# Patient Record
Sex: Female | Born: 1995 | Race: White | Hispanic: No | Marital: Single | Smoking: Never smoker
Health system: Southern US, Community
[De-identification: ages and names within clinical notes are randomized; demographics above are authoritative.]

## PROBLEM LIST (undated history)

## (undated) DIAGNOSIS — F430 Acute stress reaction: Secondary | ICD-10-CM

---
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
## 2014-03-19 ENCOUNTER — Emergency Department (HOSPITAL_COMMUNITY): Payer: 59

## 2014-03-19 ENCOUNTER — Emergency Department (HOSPITAL_COMMUNITY)
Admission: EM | Admit: 2014-03-19 | Discharge: 2014-03-19 | Disposition: A | Discharge status: Home / Self Care | Payer: 59 | Attending: Emergency Medicine | Admitting: Emergency Medicine

## 2014-03-19 ENCOUNTER — Encounter (HOSPITAL_COMMUNITY): Payer: Self-pay | Admitting: Emergency Medicine

## 2014-03-19 DIAGNOSIS — Z23 Encounter for immunization: Secondary | ICD-10-CM | POA: Insufficient documentation

## 2014-03-19 DIAGNOSIS — Z880 Allergy status to penicillin: Secondary | ICD-10-CM | POA: Diagnosis not present

## 2014-03-19 DIAGNOSIS — S80811A Abrasion, right lower leg, initial encounter: Secondary | ICD-10-CM | POA: Insufficient documentation

## 2014-03-19 DIAGNOSIS — S8011XA Contusion of right lower leg, initial encounter: Secondary | ICD-10-CM | POA: Insufficient documentation

## 2014-03-19 DIAGNOSIS — Z79899 Other long term (current) drug therapy: Secondary | ICD-10-CM | POA: Diagnosis not present

## 2014-03-19 DIAGNOSIS — Y9289 Other specified places as the place of occurrence of the external cause: Secondary | ICD-10-CM | POA: Diagnosis not present

## 2014-03-19 DIAGNOSIS — Y9389 Activity, other specified: Secondary | ICD-10-CM | POA: Insufficient documentation

## 2014-03-19 DIAGNOSIS — S90112A Contusion of left great toe without damage to nail, initial encounter: Secondary | ICD-10-CM | POA: Insufficient documentation

## 2014-03-19 DIAGNOSIS — S40011A Contusion of right shoulder, initial encounter: Secondary | ICD-10-CM | POA: Diagnosis not present

## 2014-03-19 DIAGNOSIS — Y998 Other external cause status: Secondary | ICD-10-CM | POA: Diagnosis not present

## 2014-03-19 DIAGNOSIS — Z8659 Personal history of other mental and behavioral disorders: Secondary | ICD-10-CM | POA: Diagnosis not present

## 2014-03-19 DIAGNOSIS — S8991XA Unspecified injury of right lower leg, initial encounter: Secondary | ICD-10-CM | POA: Diagnosis present

## 2014-03-19 HISTORY — DX: Acute stress reaction: F43.0

## 2014-03-19 MED ORDER — MELOXICAM 7.5 MG PO TABS: 15.0000 mg | ORAL_TABLET | Freq: Every day | ORAL | Status: AC

## 2014-03-19 MED ORDER — NAPROXEN 500 MG PO TABS
500.0000 mg | ORAL_TABLET | Freq: Once | ORAL | Status: AC
  Administered 2014-03-19: 500 mg via ORAL
  Filled 2014-03-19: qty 1

## 2014-03-19 MED ORDER — TETANUS-DIPHTH-ACELL PERTUSSIS 5-2.5-18.5 LF-MCG/0.5 IM SUSP
0.5000 mL | Freq: Once | INTRAMUSCULAR | Status: AC
  Administered 2014-03-19: 0.5 mL via INTRAMUSCULAR
  Filled 2014-03-19: qty 0.5

## 2014-03-19 NOTE — Discharge Instructions (Signed)
Contusion °A contusion is a deep bruise. Contusions are the result of an injury that caused bleeding under the skin. The contusion may turn blue, purple, or yellow. Minor injuries will give you a painless contusion, but more severe contusions may stay painful and swollen for a few weeks.  °CAUSES  °A contusion is usually caused by a blow, trauma, or direct force to an area of the body. °SYMPTOMS  °· Swelling and redness of the injured area. °· Bruising of the injured area. °· Tenderness and soreness of the injured area. °· Pain. °DIAGNOSIS  °The diagnosis can be made by taking a history and physical exam. An X-ray, CT scan, or MRI may be needed to determine if there were any associated injuries, such as fractures. °TREATMENT  °Specific treatment will depend on what area of the body was injured. In general, the best treatment for a contusion is resting, icing, elevating, and applying cold compresses to the injured area. Over-the-counter medicines may also be recommended for pain control. Ask your caregiver what the best treatment is for your contusion. °HOME CARE INSTRUCTIONS  °· Put ice on the injured area. °· Put ice in a plastic bag. °· Place a towel between your skin and the bag. °· Leave the ice on for 15-20 minutes, 3-4 times a day, or as directed by your health care provider. °· Only take over-the-counter or prescription medicines for pain, discomfort, or fever as directed by your caregiver. Your caregiver may recommend avoiding anti-inflammatory medicines (aspirin, ibuprofen, and naproxen) for 48 hours because these medicines may increase bruising. °· Rest the injured area. °· If possible, elevate the injured area to reduce swelling. °SEEK IMMEDIATE MEDICAL CARE IF:  °· You have increased bruising or swelling. °· You have pain that is getting worse. °· Your swelling or pain is not relieved with medicines. °MAKE SURE YOU:  °· Understand these instructions. °· Will watch your condition. °· Will get help right  away if you are not doing well or get worse. °Document Released: 10/25/2004 Document Revised: 01/20/2013 Document Reviewed: 11/20/2010 °ExitCare® Patient Information ©2015 [employer]. This information is not intended to replace advice given to you by your health care provider. Make sure you discuss any questions you have with your health care provider. °RICE: Routine Care for Injuries °The routine care of many injuries includes Rest, Ice, Compression, and Elevation (RICE). °HOME CARE INSTRUCTIONS °· Rest is needed to allow your body to heal. Routine activities can usually be resumed when comfortable. Injured tendons and bones can take up to 6 weeks to heal. Tendons are the cord-like structures that attach muscle to bone. °· Ice following an injury helps keep the swelling down and reduces pain. °¨ Put ice in a plastic bag. °¨ Place a towel between your skin and the bag. °¨ Leave the ice on for 15-20 minutes, 3-4 times a day, or as directed by your health care provider. Do this while awake, for the first 24 to 48 hours. After that, continue as directed by your caregiver. °· Compression helps keep swelling down. It also gives support and helps with discomfort. If an elastic bandage has been applied, it should be removed and reapplied every 3 to 4 hours. It should not be applied tightly, but firmly enough to keep swelling down. Watch fingers or toes for swelling, bluish discoloration, coldness, numbness, or excessive pain. If any of these problems occur, remove the bandage and reapply loosely. Contact your caregiver if these problems continue. °· Elevation helps reduce swelling   and decreases pain. With extremities, such as the arms, hands, legs, and feet, the injured area should be placed near or above the level of the heart, if possible. °SEEK IMMEDIATE MEDICAL CARE IF: °· You have persistent pain and swelling. °· You develop redness, numbness, or unexpected weakness. °· Your symptoms are getting worse rather than  improving after several days. °These symptoms may indicate that further evaluation or further X-rays are needed. Sometimes, X-rays may not show a small broken bone (fracture) until 1 week or 10 days later. Make a follow-up appointment with your caregiver. Ask when your X-ray results will be ready. Make sure you get your X-ray results. °Document Released: 04/29/2000 Document Revised: 01/20/2013 Document Reviewed: 06/16/2010 °ExitCare® Patient Information ©2015 [employer]. This information is not intended to replace advice given to you by your health care provider. Make sure you discuss any questions you have with your health care provider. ° °

## 2014-03-19 NOTE — ED Notes (Signed)
Pt was thrown off a horse an hour ago. Landed on R shoulder, ribs and leg. Pt thinks horse stepped on L toe. Pt does not think she hit her head, no LOC. Pt ambulatory to triage room. No difficulty moving extremities.

## 2014-03-19 NOTE — ED Provider Notes (Signed)
CSN: [id number]     Arrival date & time 03/19/14  1840 History   None    Chief Complaint  Patient presents with  . Fall    off horse   Patient is a 19 y.o. female presenting with fall. The history is provided by the patient. No language interpreter was used.  Fall  This chart was scribed for non-physician practitioner, Antony MaduraKelly Winta Barcelo, PA-C, working with Toy CookeyMegan Docherty, MD, by Andrew Auaven Small, ED Scribe. This patient was seen in room WTR5/WTR5 and the patient's care was started at 9:50 PM.  [name] is a 19 y.o. female who presents to the Emergency Department complaining of fall that occurred 1 hour PTA. Pt states she was bucked off her horse causing her to fall and land on her right side. Pt denies wearing a helmet as well as head trauma or LOC. She reports while on the ground the horse stepped on her R leg. She states she was able to ambulate after fall. Pt now has gradually worsening right rib pain, right shoulder pain, right lower leg abrasion, and left great toe pain. Pt has not taken pain medication for symptoms. She denies pain with inspiration, bladder or bowel incontinence, extremity numbness/weakness, lightheadedness, nausea, vomiting, headache, and neck pain. Pt is unsure of last TDAP.    Past Medical History  Diagnosis Date  . Stress reaction    History reviewed. No pertinent past surgical history. History reviewed. No pertinent family history. History  Substance Use Topics  . Smoking status: Never Smoker   . Smokeless tobacco: Not on file  . Alcohol Use: Yes   OB History    No data available      Review of Systems  Genitourinary: Negative for enuresis.  Musculoskeletal: Positive for myalgias. Negative for gait problem.  Neurological: Negative for syncope.  All other systems reviewed and are negative.   Allergies  Amoxicillin and Codeine  Home Medications   Prior to Admission medications   Medication Sig Start Date End Date Taking? Authorizing Provider   norgestimate-ethinyl estradiol (ORTHO-CYCLEN,SPRINTEC,PREVIFEM) 0.25-35 MG-MCG tablet Take 1 tablet by mouth daily.   Yes Historical Provider, MD  sertraline (ZOLOFT) 50 MG tablet Take 50 mg by mouth daily.   Yes Historical Provider, MD  meloxicam (MOBIC) 7.5 MG tablet Take 2 tablets (15 mg total) by mouth daily. 03/19/14   Antony MaduraKelly Tamilyn Lupien, PA-C   BP 129/88 mmHg  Pulse 95  Temp(Src) 98.4 F (36.9 C) (Oral)  Resp 16  SpO2 100%  LMP 03/12/2014   Physical Exam  Constitutional: She is oriented to person, place, and time. She appears well-developed and well-nourished. No distress.  Nontoxic/nonseptic appearing  HENT:  Head: Normocephalic and atraumatic.  No Battle sign or raccoons eyes.  Eyes: Conjunctivae and EOM are normal. No scleral icterus.  Neck: Normal range of motion.  No cervical midline tenderness, crepitus, or deformity. Normal range of motion exhibited.  Cardiovascular: Normal rate, regular rhythm, normal heart sounds and intact distal pulses.   DP and PT pulses 2+ bilaterally  Pulmonary/Chest: Effort normal and breath sounds normal. No respiratory distress. She has no wheezes. She has no rales. She exhibits tenderness. She exhibits no crepitus and no deformity.    Chest expansion symmetric. Lungs clear bilaterally. Patient with mild tenderness to her right lateral chest wall. No crepitus noted.  Abdominal: Soft. She exhibits no distension. There is no tenderness. There is no rebound and no guarding.  Soft, nontender. No peritoneal signs or guarding.  Musculoskeletal: Normal range  of motion.       Right shoulder: She exhibits tenderness. She exhibits normal range of motion, no bony tenderness, no swelling, no effusion, no crepitus, no pain, no spasm, normal pulse and normal strength.       Cervical back: Normal.       Thoracic back: She exhibits tenderness. She exhibits normal range of motion, no bony tenderness, no swelling, no deformity, no pain and no spasm.       Lumbar back:  Normal.       Back:       Arms:      Right lower leg: She exhibits tenderness. She exhibits no bony tenderness, no edema and no laceration.       Legs:      Left foot: There is tenderness and bony tenderness. There is normal range of motion, no swelling, normal capillary refill, no crepitus and no deformity.       Feet:  Neurological: She is alert and oriented to person, place, and time. She exhibits normal muscle tone. Coordination normal.  GCS 15. Speech is goal oriented. No focal neurologic deficits noted. Sensation to light touch intact in all extremities. Patient ambulatory with steady gait.  Skin: Skin is warm and dry. No rash noted. She is not diaphoretic. No erythema. No pallor.  Abrasion noted to lateral right lower extremity.  Psychiatric: She has a normal mood and affect. Her behavior is normal.  Nursing note and vitals reviewed.   ED Course  Procedures (including critical care time) DIAGNOSTIC STUDIES: Oxygen Saturation is 100% on RA, normal by my interpretation.    COORDINATION OF CARE: 9:50 PM- Pt advised of plan for treatment and pt agrees.  Labs Review Labs Reviewed - No data to display  Imaging Review Dg Ribs Unilateral W/chest Right  03/19/2014   CLINICAL DATA:  Right chest wall pain, fell off a  horse  EXAM: RIGHT RIBS AND CHEST - 3+ VIEW  COMPARISON:  None.  FINDINGS: Three views right ribs submitted. No acute infiltrate or pulmonary edema. No right rib fracture. No pneumothorax.  IMPRESSION: No infiltrate or pulmonary edema.  No right rib fracture.   Electronically Signed   By: Natasha Mead M.D.   On: 03/19/2014 21:15   Dg Tibia/fibula Right  03/19/2014   CLINICAL DATA:  Fall, stepped by horse  EXAM: RIGHT TIBIA AND FIBULA - 2 VIEW  COMPARISON:  None.  FINDINGS: Three views of the right tibia-fibula submitted. No acute fracture or subluxation. No radiopaque foreign body.  IMPRESSION: Negative.   Electronically Signed   By: Natasha Mead M.D.   On: 03/19/2014 21:13    Dg Foot Complete Left  03/19/2014   CLINICAL DATA:  Pt was thrown from horse earlier today - left foot pain concentrated at great toe  EXAM: LEFT FOOT - COMPLETE 3+ VIEW  COMPARISON:  None.  FINDINGS: There is no evidence of fracture or dislocation. There is no evidence of arthropathy or other focal bone abnormality. Soft tissues are unremarkable.  IMPRESSION: Negative.   Electronically Signed   By: Amie Portland M.D.   On: 03/19/2014 19:57     EKG Interpretation None      MDM   Final diagnoses:  Shoulder contusion, right, initial encounter  Contusion of great toe, left, initial encounter  Abrasion of right leg, initial encounter  Contusion of right leg, initial encounter  Fall from horse, initial encounter    19 year old female presents to the emergency department for further evaluation  of injuries following a fall from a horse approximately one hour prior to arrival. Patient neurovascularly intact. No red flags or signs concerning for cauda equina. No head trauma or loss of consciousness. Neurologic exam nonfocal. Cervical spine cleared by Nexus criteria. Remainder of physical exam as above. Imaging performed which is negative for any acute fracture, dislocation, or bony deformity. Injuries consistent with contusion. Tdap updated given her RLE abrasion. RICE and Mobic advised. Primary care follow-up advised for recheck of symptoms in one week. Return precautions given. Patient agreeable to plan with no unaddressed concerns. Patient discharged in good condition.  I personally performed the services described in this documentation, which was scribed in my presence. The recorded information has been reviewed and is accurate.   Filed Vitals:   03/19/14 1848 03/19/14 2035  BP: 129/94 129/88  Pulse: 98 95  Temp: 98.4 F (36.9 C)   TempSrc: Oral   Resp: 16 16  SpO2: 100% 100%     Antony Madura, PA-C 03/19/14 2158  Toy Cookey, MD 03/19/14 [phone]

## 2016-05-17 IMAGING — CR DG FOOT COMPLETE 3+V*L*
3 series · 3 of 3 positions shown · non-contrast
Comparison: None.

CLINICAL DATA: Pt was thrown from horse earlier today - left foot
pain concentrated at great toe

EXAM:
LEFT FOOT - COMPLETE 3+ VIEW

[x foot ap left]
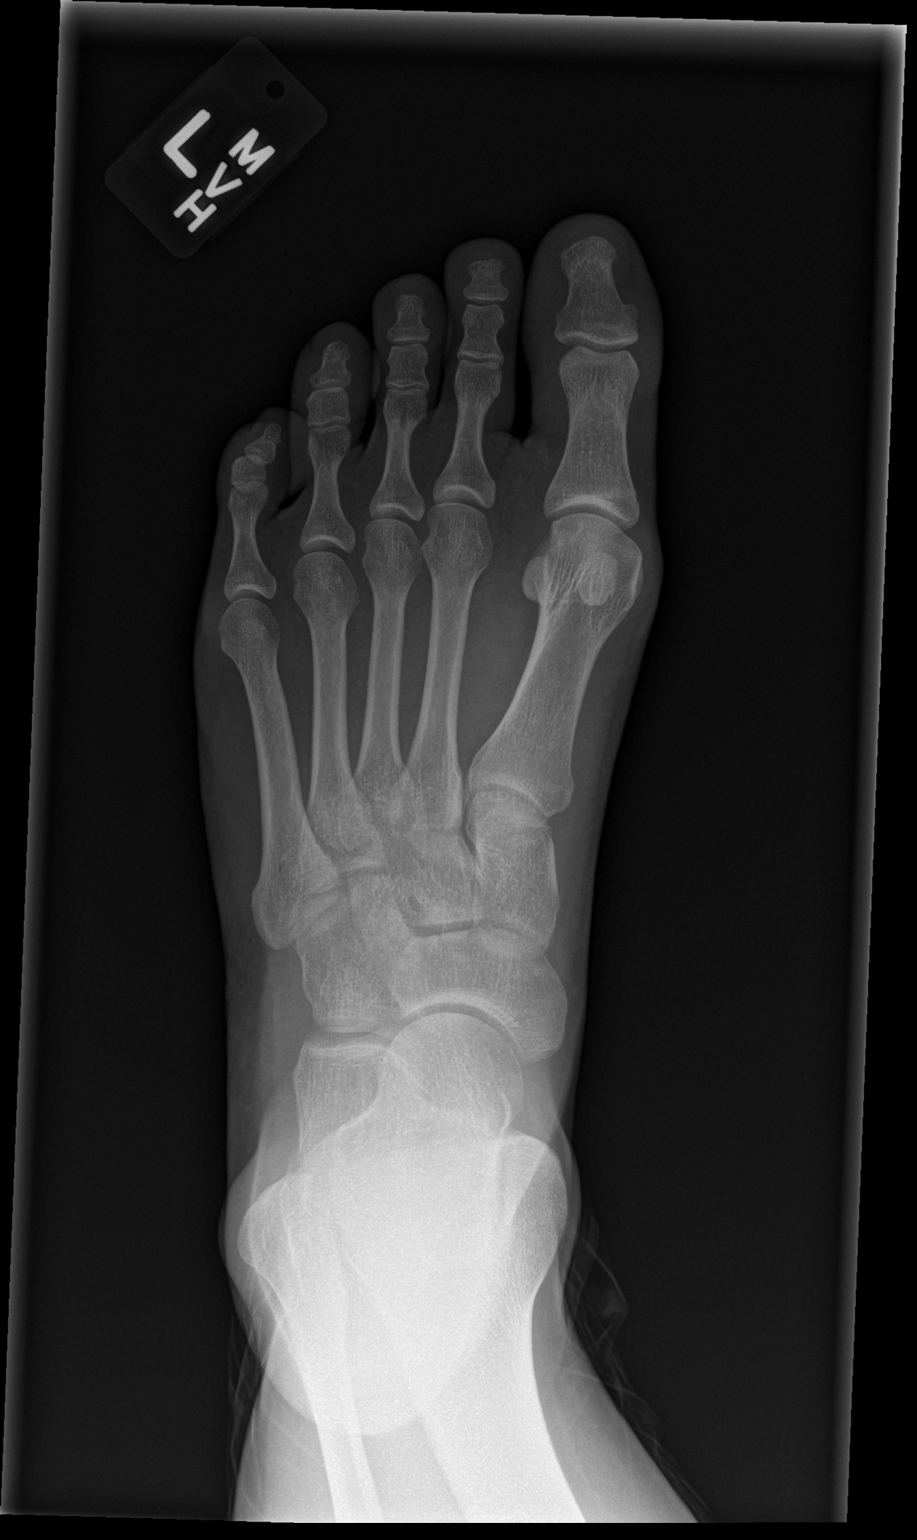

[x foot obl left]
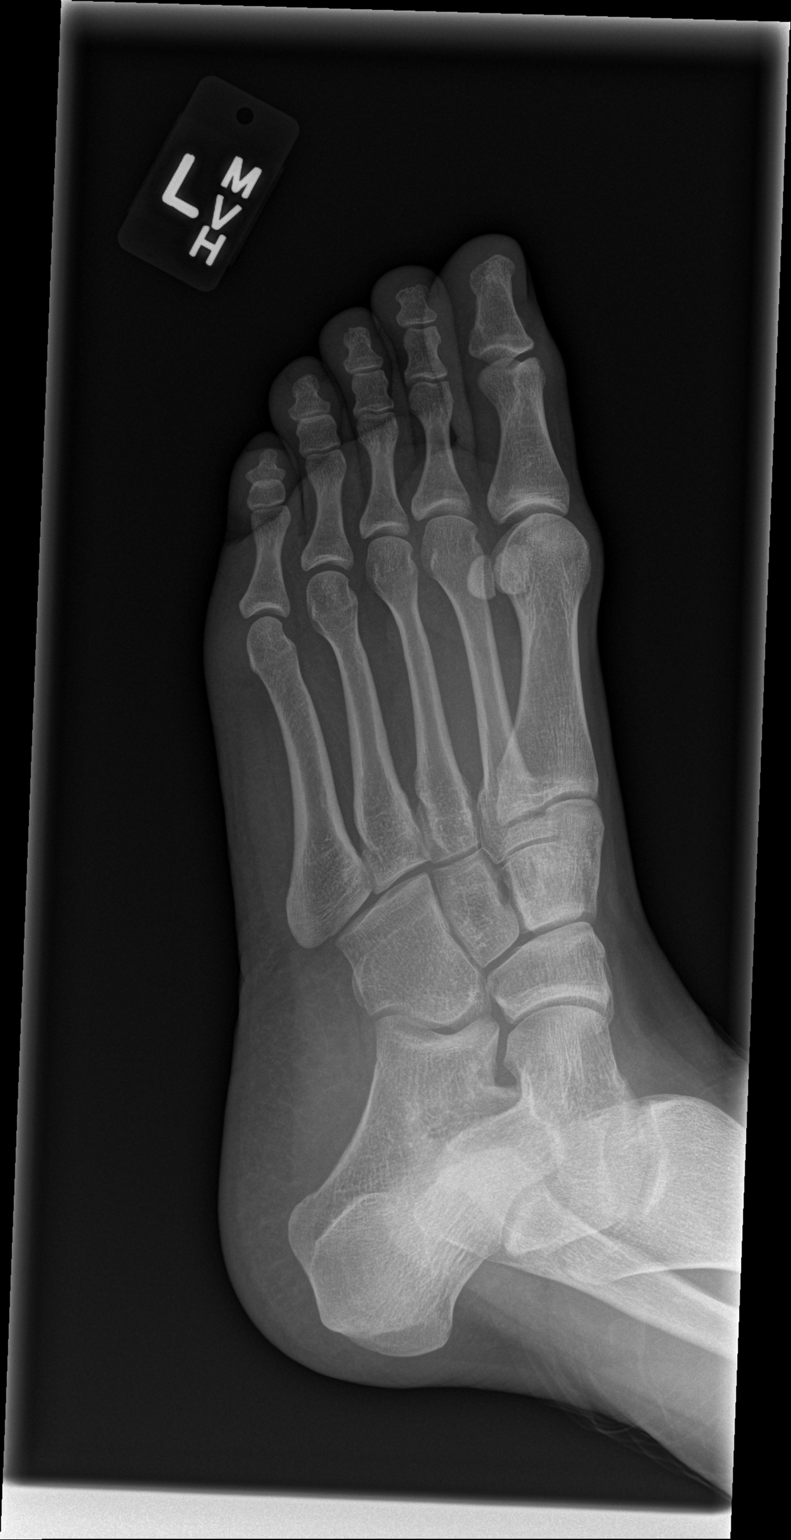

[x foot lat left]
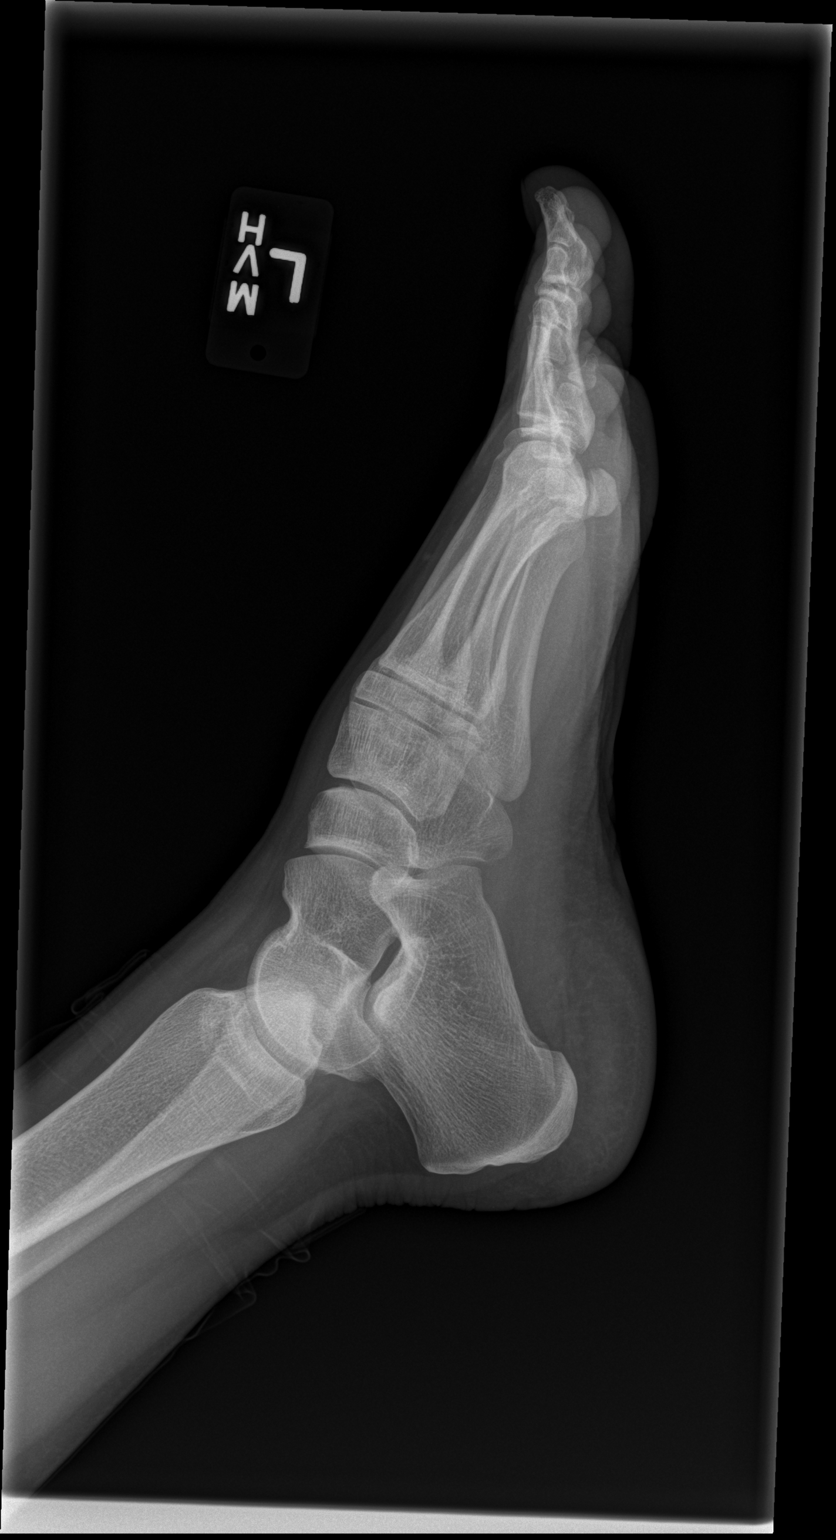

[3 of 3 positions shown; findings below may reference images not displayed]

FINDINGS: There is no evidence of fracture or dislocation. There is no
evidence of arthropathy or other focal bone abnormality. Soft
tissues are unremarkable.
IMPRESSION: Negative.

## 2019-01-08 ENCOUNTER — Encounter (HOSPITAL_COMMUNITY): Payer: Self-pay | Admitting: Emergency Medicine

## 2019-01-08 ENCOUNTER — Ambulatory Visit (HOSPITAL_COMMUNITY)
Admission: EM | Admit: 2019-01-08 | Discharge: 2019-01-08 | Disposition: A | Discharge status: Home / Self Care | Payer: 59 | Attending: Urgent Care | Admitting: Urgent Care

## 2019-01-08 DIAGNOSIS — Z20828 Contact with and (suspected) exposure to other viral communicable diseases: Secondary | ICD-10-CM

## 2019-01-08 DIAGNOSIS — J3489 Other specified disorders of nose and nasal sinuses: Secondary | ICD-10-CM

## 2019-01-08 DIAGNOSIS — J3089 Other allergic rhinitis: Secondary | ICD-10-CM

## 2019-01-08 DIAGNOSIS — Z20822 Contact with and (suspected) exposure to covid-19: Secondary | ICD-10-CM

## 2019-01-08 NOTE — ED Provider Notes (Signed)
[place]   MRN: [id number] DOB: 1995-10-05  Subjective:   Patricia Brennan is a 23 y.o. female presenting for COVID-19 testing given close exposure to her boyfriend that turned out to have positive Covid.  Patient has had 1 week history of runny and stuffy nose that has been pretty persistent.  Symptoms started when she was working outdoors for really long time, at 40 degree weather all day.  She is on Zyrtec with good results.  Has a history of allergic rhinitis which has been mild in general.  Denies history of asthma, smoking history.  No current facility-administered medications for this encounter.  Current Outpatient Medications:  .  meloxicam (MOBIC) 7.5 MG tablet, Take 2 tablets (15 mg total) by mouth daily., Disp: 30 tablet, Rfl: 0 .  norgestimate-ethinyl estradiol (ORTHO-CYCLEN,SPRINTEC,PREVIFEM) 0.25-35 MG-MCG tablet, Take 1 tablet by mouth daily., Disp: , Rfl:  .  sertraline (ZOLOFT) 50 MG tablet, Take 50 mg by mouth daily., Disp: , Rfl:    Allergies  Allergen Reactions  . Amoxicillin Other (See Comments)    Swollen vagina  . Codeine Hives    Past Medical History:  Diagnosis Date  . Stress reaction      History reviewed. No pertinent surgical history.  History reviewed. No pertinent family history.  Social History   Tobacco Use  . Smoking status: Never Smoker  Substance Use Topics  . Alcohol use: Yes  . Drug use: No    Review of Systems  Constitutional: Negative for fever and malaise/fatigue.  HENT: Positive for congestion. Negative for ear pain, sinus pain and sore throat.   Eyes: Negative for discharge and redness.  Respiratory: Negative for cough, hemoptysis, shortness of breath and wheezing.   Cardiovascular: Negative for chest pain.  Gastrointestinal: Negative for abdominal pain, diarrhea, nausea and vomiting.  Genitourinary: Negative for dysuria, flank pain and hematuria.  Musculoskeletal: Negative for myalgias.  Skin: Negative for rash.   Neurological: Negative for dizziness, weakness and headaches.  Psychiatric/Behavioral: Negative for depression and substance abuse.     Objective:   Vitals: BP (!) 114/91 (BP Location: Left Arm)   Pulse 81   Temp 98.3 F (36.8 C) (Oral)   Resp 16   LMP 12/09/2018   SpO2 100%   Physical Exam Constitutional:      General: She is not in acute distress.    Appearance: Normal appearance. She is well-developed. She is not ill-appearing, toxic-appearing or diaphoretic.  HENT:     Head: Normocephalic and atraumatic.     Nose: Nose normal.     Mouth/Throat:     Mouth: Mucous membranes are moist.  Eyes:     Extraocular Movements: Extraocular movements intact.     Pupils: Pupils are equal, round, and reactive to light.  Cardiovascular:     Rate and Rhythm: Normal rate and regular rhythm.     Pulses: Normal pulses.     Heart sounds: Normal heart sounds. No murmur. No friction rub. No gallop.   Pulmonary:     Effort: Pulmonary effort is normal. No respiratory distress.     Breath sounds: Normal breath sounds. No stridor. No wheezing, rhonchi or rales.  Skin:    General: Skin is warm and dry.     Findings: No rash.  Neurological:     Mental Status: She is alert and oriented to person, place, and time.  Psychiatric:        Mood and Affect: Mood normal.  Behavior: Behavior normal.        Thought Content: Thought content normal.        Judgment: Judgment normal.      Assessment and Plan :   1. Close exposure to COVID-19 virus   2. Stuffy and runny nose   3. Allergic rhinitis due to other allergic trigger, unspecified seasonality     Will manage for viral illness such as viral URI, viral rhinitis, possible COVID-19. Counseled patient on nature of COVID-19 including modes of transmission, diagnostic testing, management and supportive care.  Offered symptomatic relief. COVID 19 testing is pending. Counseled patient on potential for adverse effects with medications  prescribed/recommended today, ER and return-to-clinic precautions discussed, patient verbalized understanding.     Wallis Bamberg, [place] 01/08/19 1207

## 2019-01-08 NOTE — ED Triage Notes (Signed)
Here for COVID testing... reports exposure by friend who tested positive  Pt does reports some nasal congestion/drainage but resolves w/OTC meds  Denies fevers, SOB, dyspnea  A&O x4... NAD.Marland Kitchen. ambulatory

## 2019-01-08 NOTE — Discharge Instructions (Addendum)
We will manage this as a viral syndrome. For sore throat or cough try using a honey-based tea. Use 3 teaspoons of honey with juice squeezed from half lemon. Place shaved pieces of ginger into 1/2-1 cup of water and warm over stove top. Then mix the ingredients and repeat every 4 hours as needed. Please take Tylenol 500mg every 6 hours. Hydrate very well with at least 2 liters of water. Eat light meals such as soups to replenish electrolytes and soft fruits, veggies. Start an antihistamine like Zyrtec, Allegra or Claritin for postnasal drainage, sinus congestion.  You can take this together with pseudoephedrine (Sudafed) at a dose of 60 mg 3 times a day or 120 mg twice daily as needed for the same kind of congestion.    

## 2019-01-10 LAB — NOVEL CORONAVIRUS, NAA (HOSP ORDER, SEND-OUT TO REF LAB; TAT 18-24 HRS): SARS-CoV-2, NAA: NOT DETECTED

## 2019-05-14 ENCOUNTER — Ambulatory Visit: Payer: 59 | Attending: Internal Medicine

## 2019-05-14 ENCOUNTER — Ambulatory Visit: Payer: 59

## 2019-05-15 ENCOUNTER — Ambulatory Visit: Payer: 59

## 2019-06-17 ENCOUNTER — Ambulatory Visit: Payer: 59

## 2019-08-05 ENCOUNTER — Other Ambulatory Visit: Payer: Self-pay

## 2019-08-05 ENCOUNTER — Inpatient Hospital Stay: Admission: RE | Admit: 2019-08-05 | Payer: BC Managed Care – PPO | Source: Ambulatory Visit

## 2019-08-05 ENCOUNTER — Ambulatory Visit (INDEPENDENT_AMBULATORY_CARE_PROVIDER_SITE_OTHER)
Admission: RE | Admit: 2019-08-05 | Discharge: 2019-08-05 | Disposition: A | Discharge status: Home / Self Care | Payer: BC Managed Care – PPO | Source: Ambulatory Visit

## 2019-08-05 ENCOUNTER — Telehealth: Payer: 59

## 2019-08-05 DIAGNOSIS — R1013 Epigastric pain: Secondary | ICD-10-CM

## 2019-08-05 MED ORDER — OMEPRAZOLE 20 MG PO CPDR: 20.0000 mg | DELAYED_RELEASE_CAPSULE | Freq: Every day | ORAL | 0 refills | Status: AC

## 2019-08-05 NOTE — Discharge Instructions (Addendum)
Take the omeprazole as directed.    Call your primary care provider to schedule a visit in the next 2 weeks.

## 2019-08-05 NOTE — ED Provider Notes (Signed)
Virtual Visit via Video Note:  [name]  initiated request for Telemedicine visit with [name] team. I connected with Patricia Brennan  on 08/05/2019 at 2:39 PM  for a synchronized telemedicine visit using a video enabled HIPPA compliant telemedicine application. I verified that I am speaking with Patricia Brennan  using two identifiers. [name], NP  was physically located in a [facility] Urgent care site and [name] was located at a different location.   The limitations of evaluation and management by telemedicine as well as the availability of in-person appointments were discussed. Patient was informed that she  may incur a bill ( including co-pay) for this virtual visit encounter. Patricia Brennan  expressed understanding and gave verbal consent to proceed with virtual visit.     History of Present Illness:Patricia Brennan  is a 24 y.o. female presents for evaluation of abdominal discomfort x 2 days.  The pain feels like cramping and is currently 5/10; worse in the morning; relived with eating.  Increased belching and bloating.  No nausea or vomiting.  She had diarrhea yesterday; none today.  She denies fever, chills, cough, SOB, rash, or other symptoms.  She denies current pregnancy or breastfeeding.      Allergies  Allergen Reactions  . Amoxicillin Other (See Comments)    Swollen vagina  . Codeine Hives     Past Medical History:  Diagnosis Date  . Stress reaction      Social History   Tobacco Use  . Smoking status: Never Smoker  . Smokeless tobacco: Never Used  Substance Use Topics  . Alcohol use: Yes  . Drug use: No    ROS: as stated in HPI.  All other systems reviewed and negative.       Observations/Objective: Physical Exam  VITALS: Patient denies fever. GENERAL: Alert, appears well and in no acute distress. HEENT: Atraumatic. Oral mucosa appears moist. NECK: Normal movements of the head and neck. CARDIOPULMONARY: No increased  WOB. Speaking in clear sentences. I:E ratio WNL.  MS: Moves all visible extremities without noticeable abnormality. PSYCH: Pleasant and cooperative, well-groomed. Speech normal rate and rhythm. Affect is appropriate. Insight and judgement are appropriate. Attention is focused, linear, and appropriate.  NEURO: CN grossly intact. Oriented as arrived to appointment on time with no prompting. Moves both UE equally.  SKIN: No obvious lesions, wounds, erythema, or cyanosis noted on face or hands.   Assessment and Plan:    ICD-10-CM   1. Epigastric pain  R10.13        Follow Up Instructions: Treating with omeprazole x14 days.  Instructed patient to go to the ED if she has acute abdominal pain or other concerning symptoms.  Instructed her to follow-up with her PCP in the next 2 weeks.  Patient agrees to plan of care.      I discussed the assessment and treatment plan with the patient. The patient was provided an opportunity to ask questions and all were answered. The patient agreed with the plan and demonstrated an understanding of the instructions.   The patient was advised to call back or seek an in-person evaluation if the symptoms worsen or if the condition fails to improve as anticipated.      [name], NP  08/05/2019 2:39 PM         [name], NP 08/05/19 1439

## 2020-02-10 DIAGNOSIS — Z1231 Encounter for screening mammogram for malignant neoplasm of breast: Secondary | ICD-10-CM

## 2022-02-02 ENCOUNTER — Emergency Department (HOSPITAL_BASED_OUTPATIENT_CLINIC_OR_DEPARTMENT_OTHER): Payer: BC Managed Care – PPO | Admitting: Radiology

## 2022-02-02 ENCOUNTER — Other Ambulatory Visit: Payer: Self-pay

## 2022-02-02 ENCOUNTER — Encounter (HOSPITAL_BASED_OUTPATIENT_CLINIC_OR_DEPARTMENT_OTHER): Payer: Self-pay

## 2022-02-02 DIAGNOSIS — S92515A Nondisplaced fracture of proximal phalanx of left lesser toe(s), initial encounter for closed fracture: Secondary | ICD-10-CM | POA: Insufficient documentation

## 2022-02-02 DIAGNOSIS — X58XXXA Exposure to other specified factors, initial encounter: Secondary | ICD-10-CM | POA: Diagnosis not present

## 2022-02-02 DIAGNOSIS — S99922A Unspecified injury of left foot, initial encounter: Secondary | ICD-10-CM | POA: Diagnosis present

## 2022-02-02 LAB — CBG MONITORING, ED: Glucose-Capillary: 85 mg/dL (ref 70–99)

## 2022-02-02 NOTE — ED Triage Notes (Signed)
Pt states she dropped a 45lb weight on her left foot. Pt states she also feels like she is going to pass out. FSBS=85.  Left foot is swollen. Pt able to move her toes, cap refill distal to injury <3sec.

## 2022-02-03 ENCOUNTER — Emergency Department (HOSPITAL_BASED_OUTPATIENT_CLINIC_OR_DEPARTMENT_OTHER)
Admission: EM | Admit: 2022-02-03 | Discharge: 2022-02-03 | Disposition: A | Discharge status: Home / Self Care | Payer: BC Managed Care – PPO | Attending: Emergency Medicine | Admitting: Emergency Medicine

## 2022-02-03 DIAGNOSIS — S92512A Displaced fracture of proximal phalanx of left lesser toe(s), initial encounter for closed fracture: Secondary | ICD-10-CM

## 2022-02-03 DIAGNOSIS — S9032XA Contusion of left foot, initial encounter: Secondary | ICD-10-CM

## 2022-02-03 MED ORDER — IBUPROFEN 400 MG PO TABS
400.0000 mg | ORAL_TABLET | Freq: Once | ORAL | Status: AC
  Administered 2022-02-03: 400 mg via ORAL
  Filled 2022-02-03: qty 1

## 2022-02-03 MED ORDER — ACETAMINOPHEN 325 MG PO TABS
650.0000 mg | ORAL_TABLET | Freq: Once | ORAL | Status: AC
  Administered 2022-02-03: 650 mg via ORAL
  Filled 2022-02-03: qty 2

## 2022-02-03 NOTE — Discharge Instructions (Signed)
Apply ice for 30 minutes at a time, 4 times a day.  Buddy tape the toe as needed.  You will likely need to buddy tape the toe for approximately 3 weeks.  Wear the postop shoe as needed.  Use crutches as needed.  Take either ibuprofen or naproxen as needed for pain.  To get additional pain relief, add acetaminophen.  When you combine acetaminophen with either ibuprofen or naproxen, you get better pain relief and you get from taking either medication by itself.

## 2022-02-03 NOTE — ED Provider Notes (Signed)
[facility] Provider Note   CSN: [id number] Arrival date & time: 02/02/22  2111     History  Chief Complaint  Patient presents with   Foot Pain    Patricia Brennan is a 27 y.o. female.  The history is provided by the patient.  Foot Pain       Home Medications Prior to Admission medications   Medication Sig Start Date End Date Taking? Authorizing Provider  buPROPion (WELLBUTRIN XL) 150 MG 24 hr tablet Take 150 mg by mouth daily.   Yes [provider]  FLUoxetine (PROZAC) 40 MG capsule Take 40 mg by mouth daily.   Yes [provider]  cetirizine (ZYRTEC) 10 MG tablet Take 10 mg by mouth daily.    [provider]  meloxicam (MOBIC) 7.5 MG tablet Take 2 tablets (15 mg total) by mouth daily. 03/19/14   Antonietta Breach, PA-C  norgestimate-ethinyl estradiol (ORTHO-CYCLEN,SPRINTEC,PREVIFEM) 0.25-35 MG-MCG tablet Take 1 tablet by mouth daily.    [provider]  omeprazole (PRILOSEC) 20 MG capsule Take 1 capsule (20 mg total) by mouth daily for 14 days. 08/05/19 08/19/19  Sharion Balloon, NP  sertraline (ZOLOFT) 50 MG tablet Take 50 mg by mouth daily.    [provider]      Allergies    Amoxicillin and Codeine    Review of Systems   Review of Systems  All other systems reviewed and are negative.   Physical Exam Updated Vital Signs Ht 5\' 8"  (1.727 m)   Wt 83.9 kg   BMI 28.13 kg/m  Physical Exam Vitals and nursing note reviewed.   27 year old female, resting comfortably and in no acute distress. Vital signs were not obtainedl.  Extremities: Ecchymosis is present over the distal left midfoot with significant tenderness throughout.  There is tenderness to palpation over the left third, fourth, fifth toe with point tenderness at the region of the PIP joint of the fourth toe.  ED Results / Procedures / Treatments   Labs (all labs ordered are listed, but only abnormal results are displayed) Labs Reviewed   CBG MONITORING, ED   Radiology DG Foot Complete Left  Result Date: 02/02/2022 CLINICAL DATA:  Status post trauma to the left foot. EXAM: LEFT FOOT - COMPLETE 3+ VIEW COMPARISON:  None Available. FINDINGS: An acute, nondisplaced fracture deformity is seen involving the distal aspect of the proximal phalanx of the fourth left toe. There is no evidence of dislocation. There is no evidence of arthropathy or other focal bone abnormality. Soft tissues are unremarkable. IMPRESSION: Acute fracture of the proximal phalanx of the fourth left toe. Electronically Signed   By: Virgina Norfolk M.D.   On: 02/02/2022 22:23    Procedures Procedures    Medications Ordered in ED Medications  ibuprofen (ADVIL) tablet 400 mg (has no administration in time range)  acetaminophen (TYLENOL) tablet 650 mg (has no administration in time range)    ED Course/ Medical Decision Making/ A&P                           Medical Decision Making Risk OTC drugs. Prescription drug management.   Injury to left foot.  X-rays show nondisplaced fracture of the proximal phalanx of the left fourth toe, no other fractures.  Have independently viewed the images, and agree with radiologist's interpretation.  I have advised the patient to buddy tape the toe and I have ordered a postop shoe to  use as needed and crutches to use as needed.  Patient advised on ice and elevation, told to use over-the-counter NSAIDs and acetaminophen as needed for pain.  I have provided an orthopedic referral.  Final Clinical Impression(s) / ED Diagnoses Final diagnoses:  Closed fracture of proximal phalanx of lesser toe of left foot, initial encounter  Contusion of left foot, initial encounter    Rx / DC Orders ED Discharge Orders     None         Dione Booze, MD 02/03/22 [phone]

## 2022-02-03 NOTE — ED Notes (Signed)
J.McBride,NT placed post-op shoe on patient and is instructing pt. On crutch walking
